# Patient Record
Sex: Female | Born: 2000 | Race: Black or African American | Hispanic: No | Marital: Single | Smoking: Never smoker
Health system: Southern US, Community
[De-identification: ages and names within clinical notes are randomized; demographics above are authoritative.]

## PROBLEM LIST (undated history)

## (undated) ENCOUNTER — Inpatient Hospital Stay (HOSPITAL_COMMUNITY): Payer: Self-pay

## (undated) DIAGNOSIS — Z9189 Other specified personal risk factors, not elsewhere classified: Secondary | ICD-10-CM

## (undated) DIAGNOSIS — F321 Major depressive disorder, single episode, moderate: Secondary | ICD-10-CM

## (undated) HISTORY — PX: HERNIA REPAIR: SHX51

## (undated) HISTORY — PX: UMBILICAL HERNIA REPAIR: SUR1181

---
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
## 2018-01-03 IMAGING — DX DG FINGER THUMB 2+V*L*
3 series · 3 of 3 positions shown · non-contrast
Comparison: None.

CLINICAL DATA: Left thumb pain after pain stuck with pencil.

EXAM:
LEFT THUMB 2+V

[finger ap]
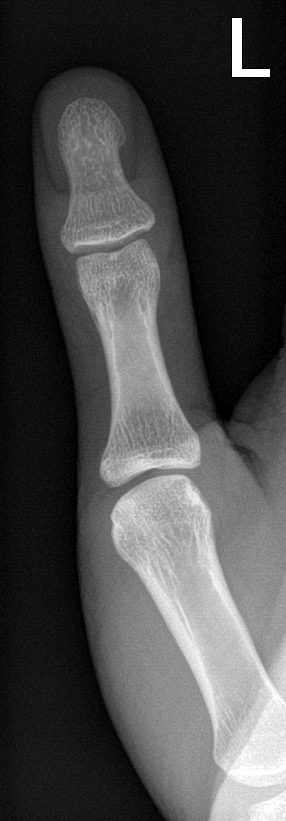

[finger obl]
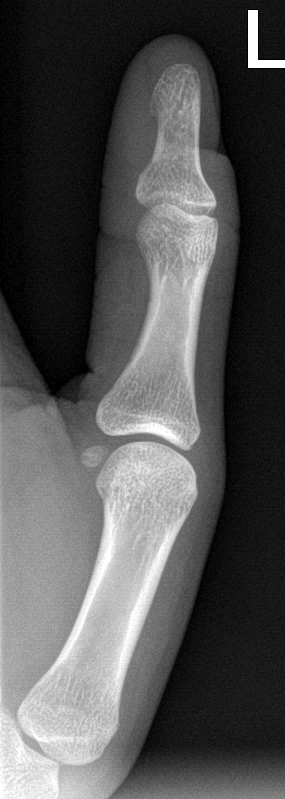

[finger lat]
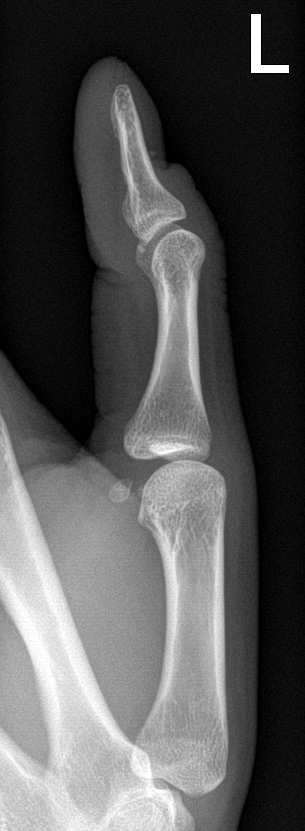

[3 of 3 positions shown; findings below may reference images not displayed]

FINDINGS: There is no evidence of fracture or dislocation. There is no
evidence of arthropathy or other focal bone abnormality. A small
radiodensity is seen along the dorsal aspect of the distal phalanx
on the lateral view, suspicious for a small radiopaque foreign body.
IMPRESSION: Small radiodensity along dorsal aspect of distal phalanx, suspicious
for radiopaque foreign body.

No osseous abnormality.

## 2018-02-22 ENCOUNTER — Emergency Department
Admission: EM | Admit: 2018-02-22 | Discharge: 2018-02-22 | Disposition: A | Discharge status: Home / Self Care | Payer: Medicaid Other | Attending: Emergency Medicine | Admitting: Emergency Medicine

## 2018-02-22 ENCOUNTER — Other Ambulatory Visit: Payer: Self-pay

## 2018-02-22 DIAGNOSIS — Z79899 Other long term (current) drug therapy: Secondary | ICD-10-CM | POA: Diagnosis not present

## 2018-02-22 DIAGNOSIS — J111 Influenza due to unidentified influenza virus with other respiratory manifestations: Secondary | ICD-10-CM | POA: Insufficient documentation

## 2018-02-22 DIAGNOSIS — R05 Cough: Secondary | ICD-10-CM | POA: Diagnosis present

## 2018-02-22 LAB — GROUP A STREP BY PCR: Group A Strep by PCR: NOT DETECTED

## 2018-02-22 MED ORDER — OSELTAMIVIR PHOSPHATE 75 MG PO CAPS: 75.0000 mg | ORAL_CAPSULE | Freq: Two times a day (BID) | ORAL | 0 refills | Status: AC

## 2018-02-22 NOTE — ED Triage Notes (Signed)
Pt A&O, ambulatory. Mask in place. States cough, fever, vomiting. C/o body aches.

## 2018-02-22 NOTE — Discharge Instructions (Addendum)
Follow-up with your regular doctor if not better in 3 days.  Return emergency department worsening. °

## 2018-02-22 NOTE — ED Provider Notes (Signed)
[facility] Emergency Department Provider Note  ____________________________________________   First MD Initiated Contact with Patient 02/22/18 1836     (approximate)  I have reviewed the triage vital signs and the nursing notes.   HISTORY  Chief Complaint Cough and Fever    HPI Valerie Franklin is a 18 y.o. female flulike symptoms, patient is complained of fever, chills, body aches.  cough, sore throat, vomiting, diarrhea; denies chest pain or sob.  Sx for 1 days   Past Medical History:  Diagnosis Date  . At risk for intentional self-harm 05/12/2016  . MDD (major depressive disorder), single episode, moderate (HCC) 05/12/2016    Patient Active Problem List   Diagnosis Date Noted  . GBS bacteriuria 09/07/2017  . Nausea and vomiting during pregnancy 08/19/2017  . MDD (major depressive disorder), single episode, moderate (HCC) 05/12/2016  . At risk for intentional self-harm 05/12/2016    Past Surgical History:  Procedure Laterality Date  . HERNIA REPAIR    . UMBILICAL HERNIA REPAIR      Prior to Admission medications   Medication Sig Start Date End Date Taking? Authorizing Provider  levonorgestrel-ethinyl estradiol (VIENVA) 0.1-20 MG-MCG tablet Take 1 tablet by mouth daily. 10/03/17   Constant, Peggy, MD  oseltamivir (TAMIFLU) 75 MG capsule Take 1 capsule (75 mg total) by mouth 2 (two) times daily. 02/22/18   Faythe Ghee, PA-C    Allergies Patient has no known allergies.  Family History  Problem Relation Age of Onset  . Stroke Paternal Grandfather     Social History Social History   Tobacco Use  . Smoking status: Never Smoker  . Smokeless tobacco: Never Used  Substance Use Topics  . Alcohol use: No  . Drug use: Not Currently    Frequency: 4.0 times per week    Types: Marijuana    Comment: Weekly    Review of Systems  Constitutional: Positive fever/chills Eyes: No visual changes. ENT: Positive sore throat. Respiratory:  Positive cough Gastrointestinal: Positive vomiting and diarrhea, 1 episode of each earlier Genitourinary: Negative for dysuria. Musculoskeletal: Negative for back pain. Skin: Negative for rash.    ____________________________________________   PHYSICAL EXAM:  VITAL SIGNS: ED Triage Vitals  Enc Vitals Group     BP 02/22/18 1804 97/71     Pulse Rate 02/22/18 1804 (!) 123     Resp 02/22/18 1804 18     Temp 02/22/18 1804 99.3 F (37.4 C)     Temp src --      SpO2 02/22/18 1804 96 %     Weight 02/22/18 1803 119 lb (54 kg)     Height 02/22/18 1803 5\' 5"  (1.651 m)     Head Circumference --      Peak Flow --      Pain Score 02/22/18 1803 8     Pain Loc --      Pain Edu? --      Excl. in GC? --     Constitutional: Alert and oriented. Well appearing and in no acute distress. Eyes: Conjunctivae are normal.  Head: Atraumatic. Nose: No congestion/rhinnorhea. Mouth/Throat: Mucous membranes are moist.  Throat is red Neck:  supple no lymphadenopathy noted Cardiovascular: Normal rate, regular rhythm. Heart sounds are normal Respiratory: Normal respiratory effort.  No retractions, lungs c t a  Abd: soft nontender bs normal all 4 quad GU: deferred Musculoskeletal: FROM all extremities, warm and well perfused Neurologic:  Normal speech and language.  Skin:  Skin is warm,  dry and intact. No rash noted. Psychiatric: Mood and affect are normal. Speech and behavior are normal.  ____________________________________________   LABS (all labs ordered are listed, but only abnormal results are displayed)  Labs Reviewed  GROUP A STREP BY PCR   ____________________________________________   ____________________________________________  RADIOLOGY    ____________________________________________   PROCEDURES  Procedure(s) performed: No  Procedures    ____________________________________________   INITIAL IMPRESSION / ASSESSMENT AND PLAN / ED COURSE  Pertinent labs &  imaging results that were available during my care of the patient were reviewed by me and considered in my medical decision making (see chart for details).   Patient is 18 year old female presents emergency department complaint flulike symptoms.  Physical exam shows a febrile patient.  Throat is red.  Remainder exam is unremarkable  Influenza test was not obtained as she is a healthy adult with very typical flulike symptoms.  Strep test is negative  Explained findings to the patient.  She was given a prescription for Tamiflu.  She is to follow-up with regular doctor if not better in 3 days.  Return emergency department worsening.  She states she understands and will comply.  She was discharged in stable condition.     As part of my medical decision making, I reviewed the following data within the electronic MEDICAL RECORD NUMBER Nursing notes reviewed and incorporated, Labs reviewed strep test is negative, Notes from prior ED visits and  Controlled Substance Database  ____________________________________________   FINAL CLINICAL IMPRESSION(S) / ED DIAGNOSES  Final diagnoses:  Influenza      NEW MEDICATIONS STARTED DURING THIS VISIT:  New Prescriptions   OSELTAMIVIR (TAMIFLU) 75 MG CAPSULE    Take 1 capsule (75 mg total) by mouth 2 (two) times daily.     Note:  This document was prepared using Dragon voice recognition software and may include unintentional dictation errors.    Faythe GheeFisher, [place] Farone W, PA-C 02/22/18 1933    Phineas SemenGoodman, Gray[place], MD 02/22/18 Rosamaria [id number]

## 2018-02-22 NOTE — ED Notes (Signed)
Reference triage note. Pt in NAD at this time.  

## 2019-02-12 ENCOUNTER — Other Ambulatory Visit: Payer: Medicaid Other

## 2019-02-13 ENCOUNTER — Ambulatory Visit: Payer: Medicaid Other | Attending: Internal Medicine

## 2019-02-13 DIAGNOSIS — Z20822 Contact with and (suspected) exposure to covid-19: Secondary | ICD-10-CM

## 2019-02-14 LAB — NOVEL CORONAVIRUS, NAA: SARS-CoV-2, NAA: DETECTED — AB

## 2019-04-15 IMAGING — US US OB < 14 WEEKS - US OB TV
1 series · 15 of 28 positions shown · non-contrast
Comparison: None.

CLINICAL DATA: Pelvic pain today.  Positive urine pregnancy test.

EXAM:
OBSTETRIC <14 WK US AND TRANSVAGINAL OB US
TECHNIQUE: Both transabdominal and transvaginal ultrasound examinations were
performed for complete evaluation of the gestation as well as the
maternal uterus, adnexal regions, and pelvic cul-de-sac.
Transvaginal technique was performed to assess early pregnancy.

[Series 1: us ob < 14 weeks - us ob tv · 15 of 67 slices shown]
[im 1/67]
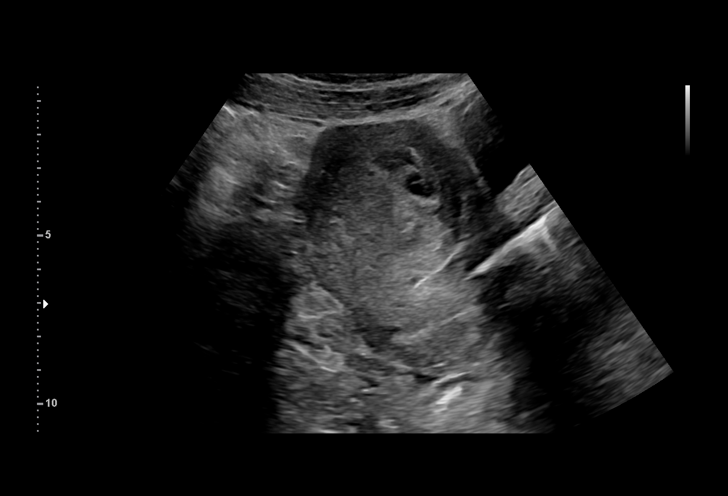
[im 5/67]
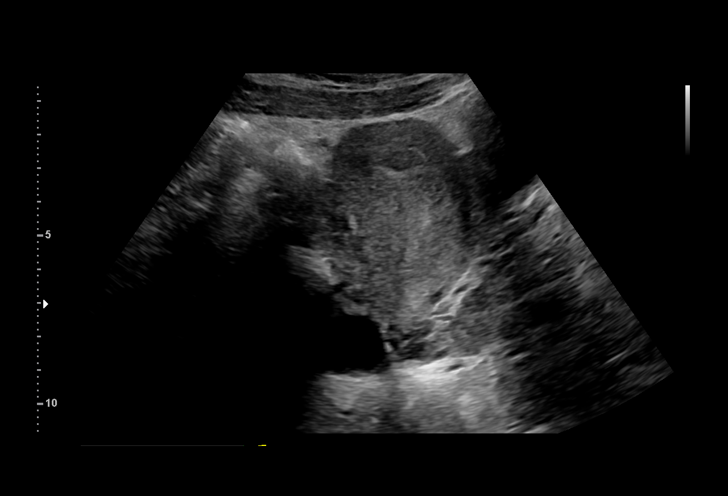
[im 10/67]
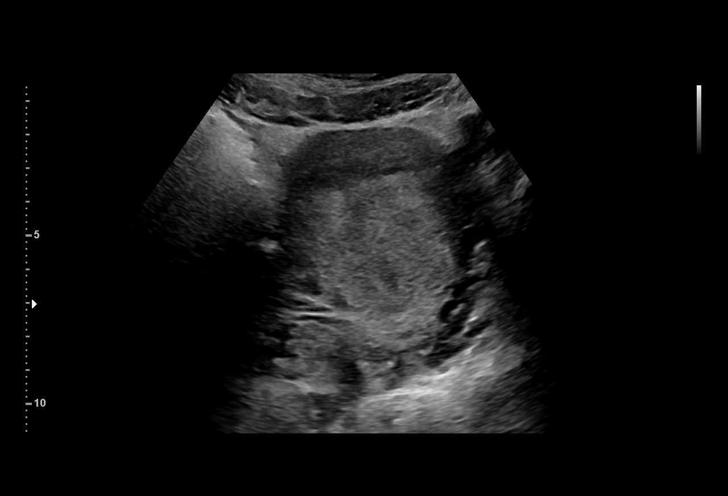
[im 15/67]
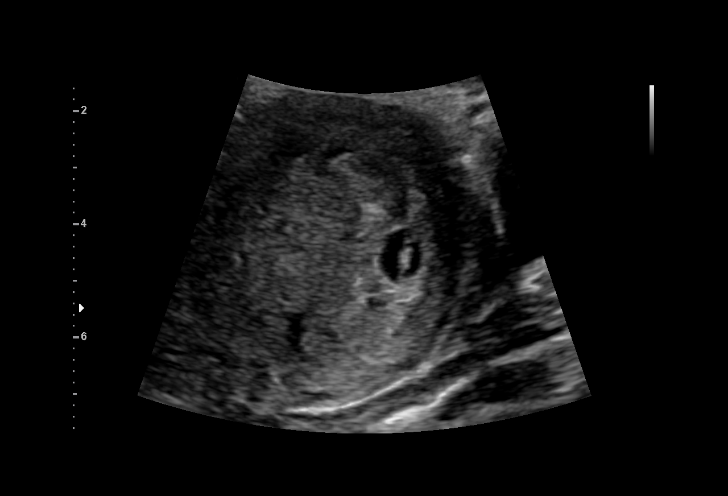
[im 20/67]
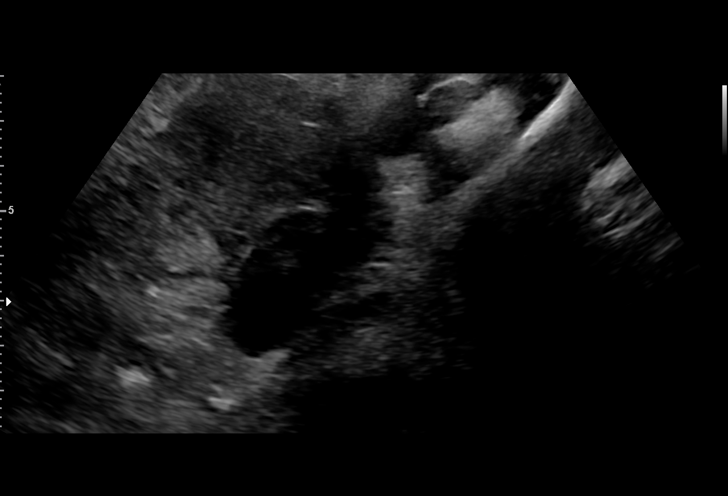
[im 25/67]
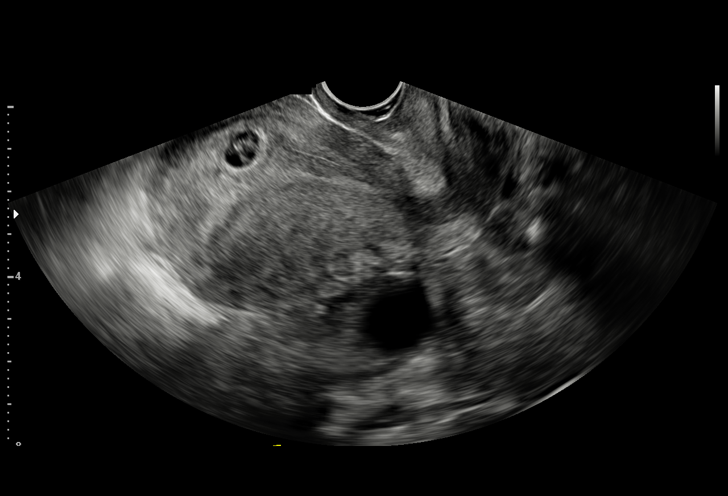
[im 30/67]
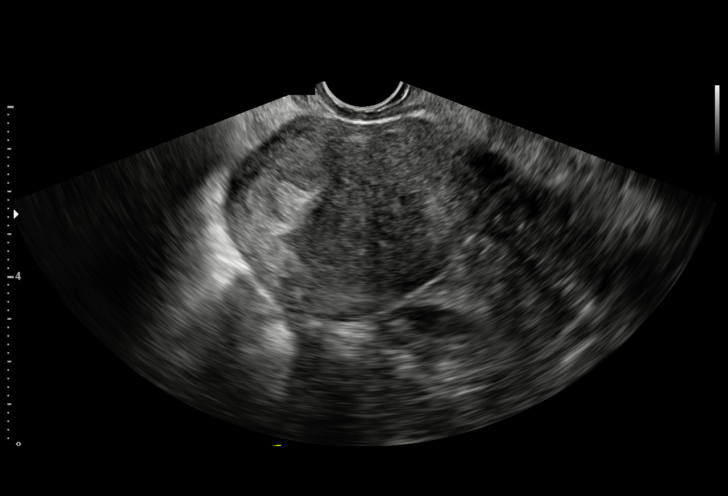
[im 35/67]
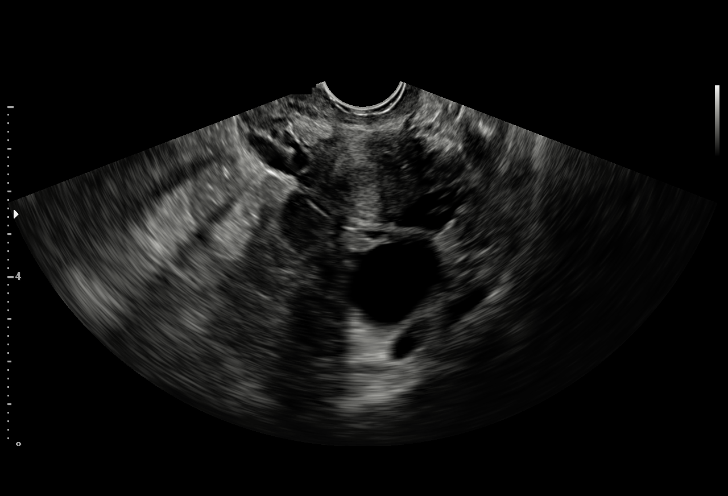
[im 37/67]
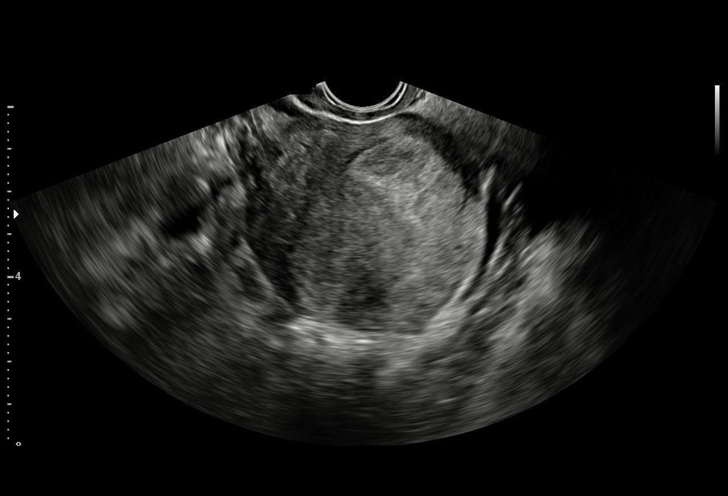
[im 42/67]
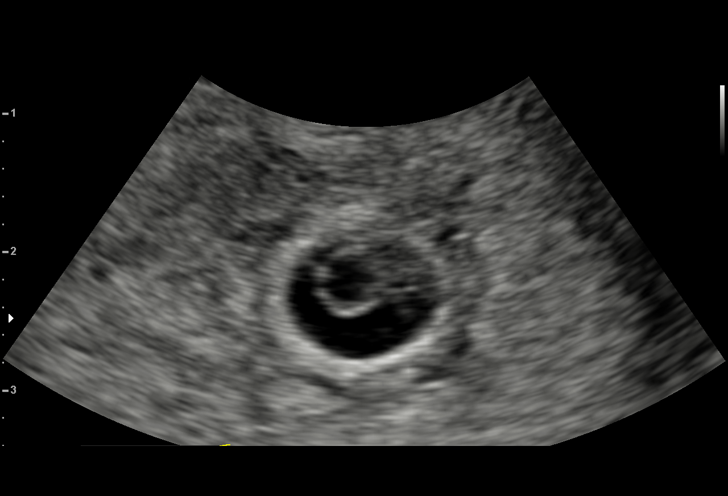
[im 47/67]
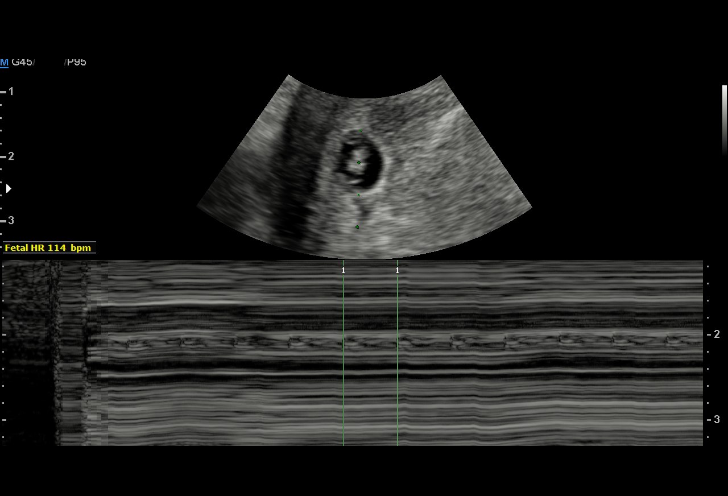
[im 52/67]
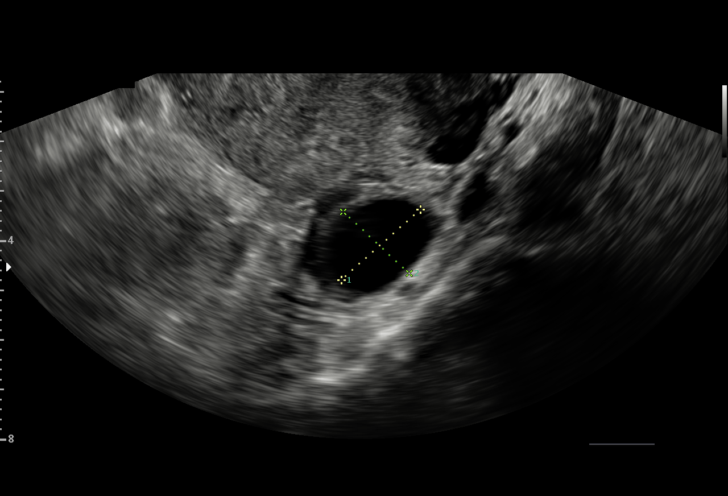
[im 57/67]
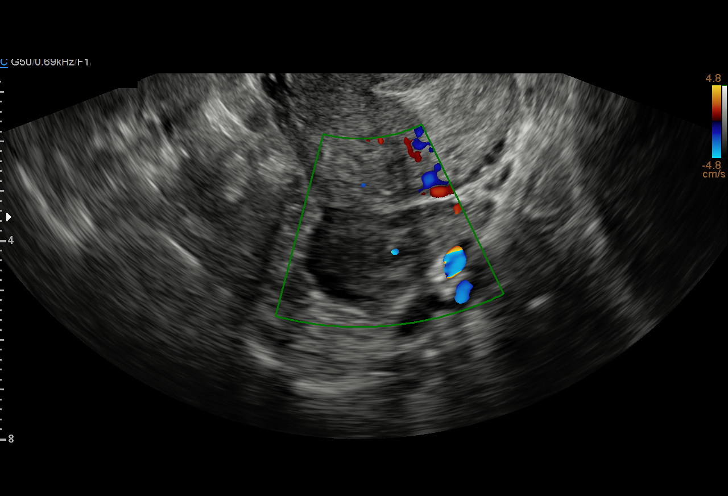
[im 62/67]
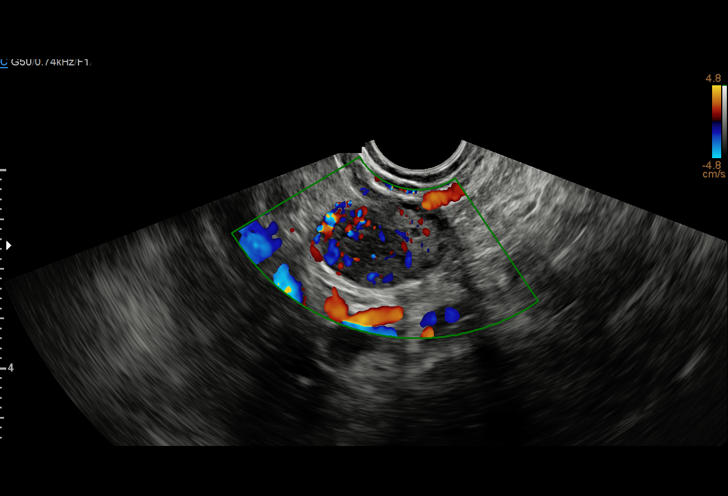
[im 67/67]
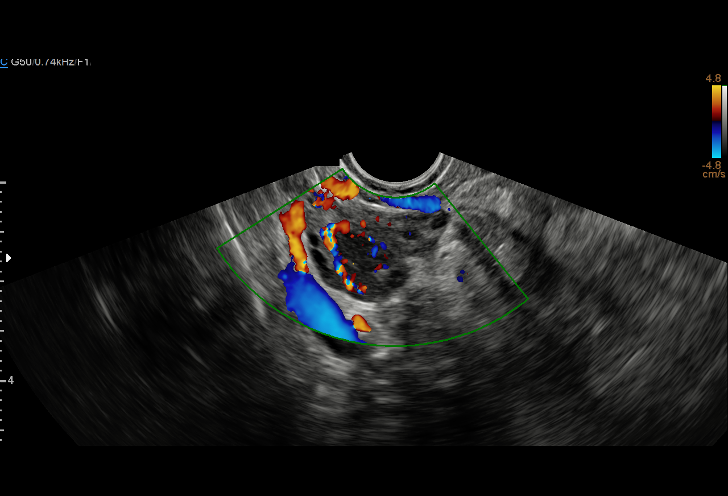

[15 of 28 positions shown; findings below may reference images not displayed]

FINDINGS: Intrauterine gestational sac: Single

Yolk sac:  Visualized

Embryo:  Visualized

Cardiac Activity: Visualized

Heart Rate: 114 bpm

CRL: 4.1 mm   6 w   1 d                  US EDC: 04/30/2018

Subchorionic hemorrhage:  None visualized.

Maternal uterus/adnexae: Corpus luteal cyst noted on the right.
Follicle measuring 2.1 x 1.8 x 2 cm noted of the left ovary.
IMPRESSION: Single live intrauterine 6 week 1 day gestation without complicating
features.

## 2019-04-28 IMAGING — US US OB TRANSVAGINAL
1 series · 15 of 21 positions shown · non-contrast
Comparison: None.

CLINICAL DATA: Pregnant patient with vaginal bleeding.

EXAM:
TRANSVAGINAL OB ULTRASOUND
TECHNIQUE: Transvaginal ultrasound was performed for complete evaluation of the
gestation as well as the maternal uterus, adnexal regions, and
pelvic cul-de-sac.

[Series 1: us ob transvaginal · 21 acquisitions, 15 frames shown]
[im 1/21]
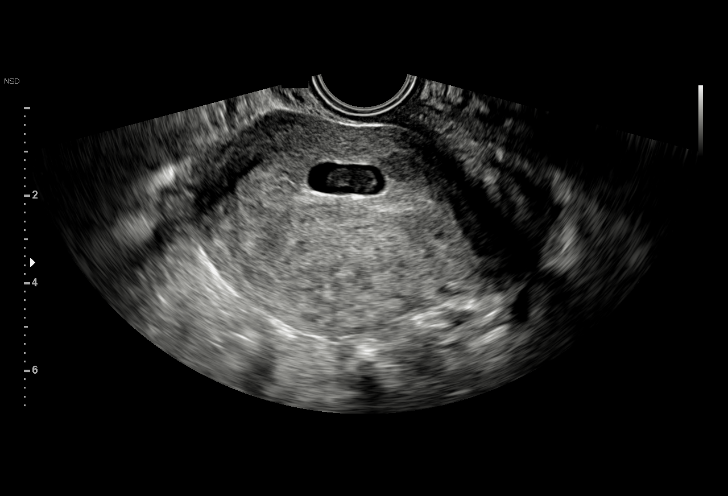
[im 3/21]
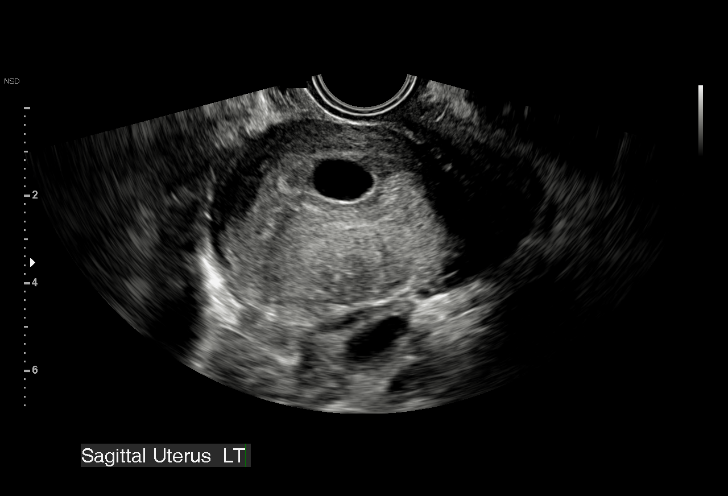
[im 4/21]
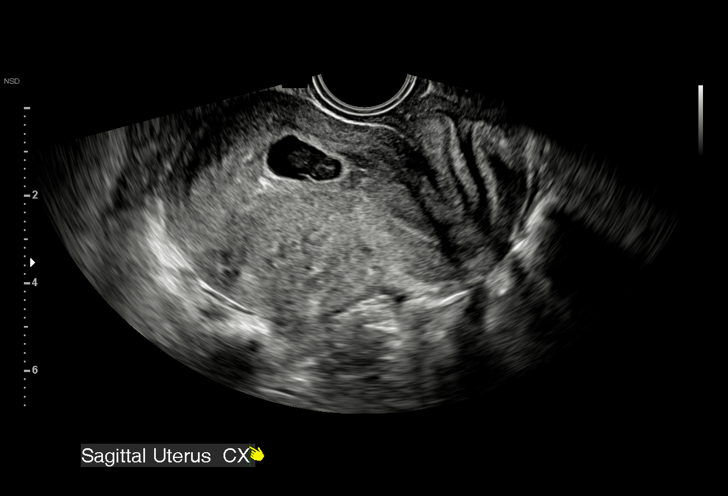
[im 5/21]
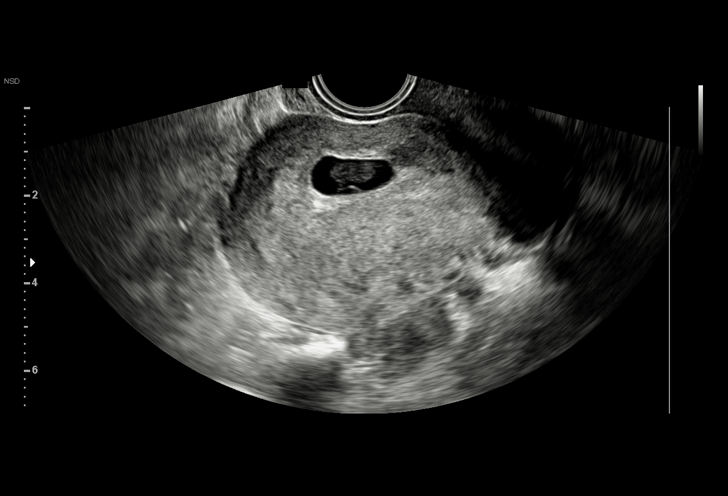
[im 7/21]
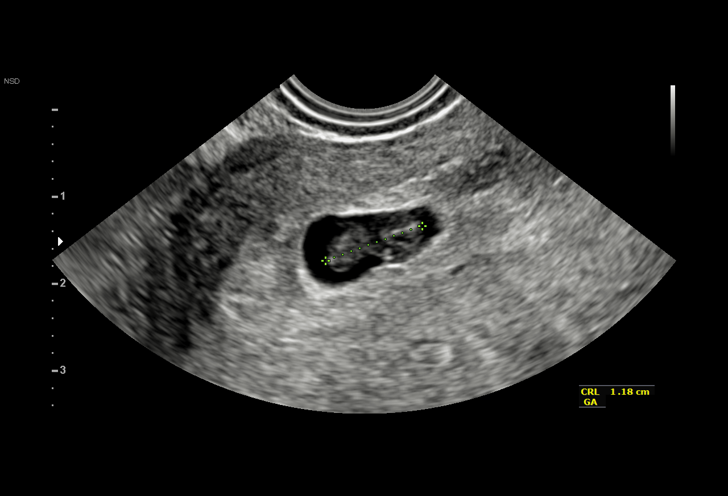
[im 8/21]
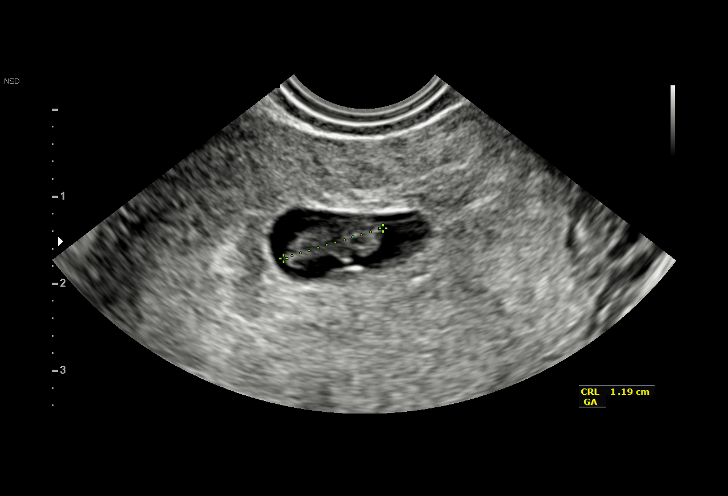
[im 10/21]
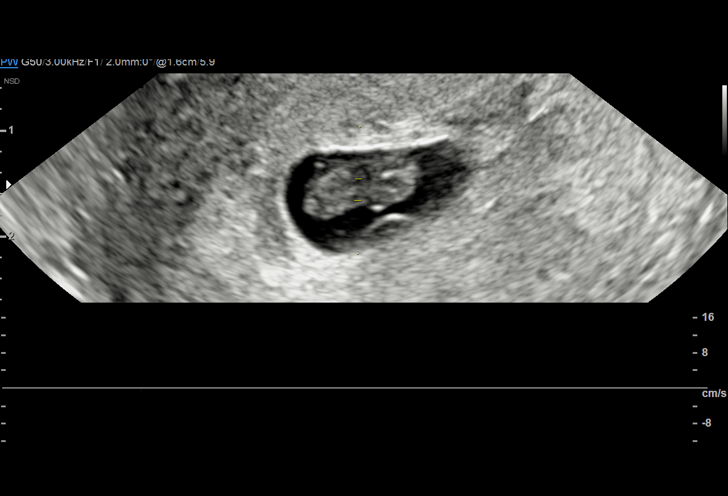
[im 11/21]
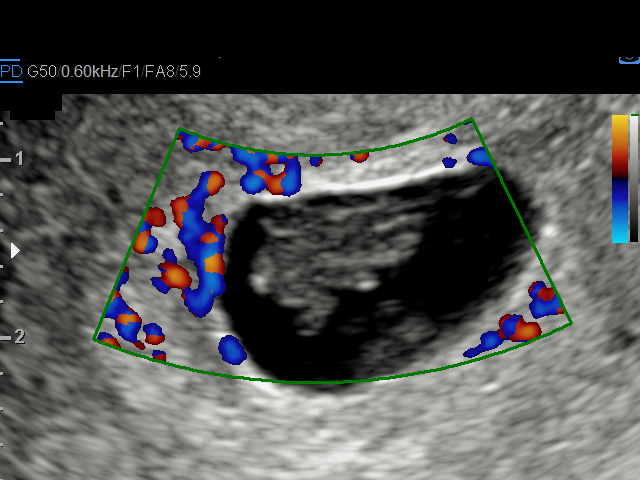
[im 12/21]
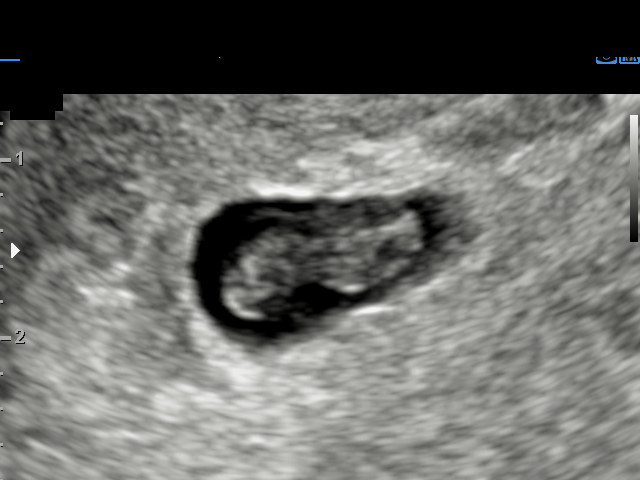
[im 14/21]
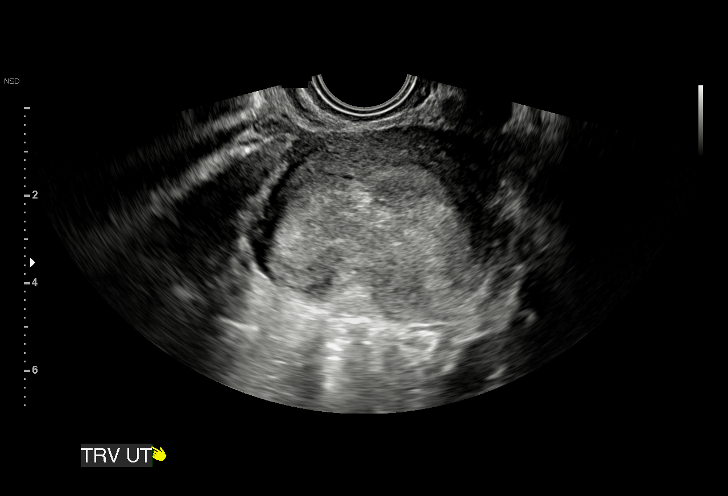
[im 15/21]
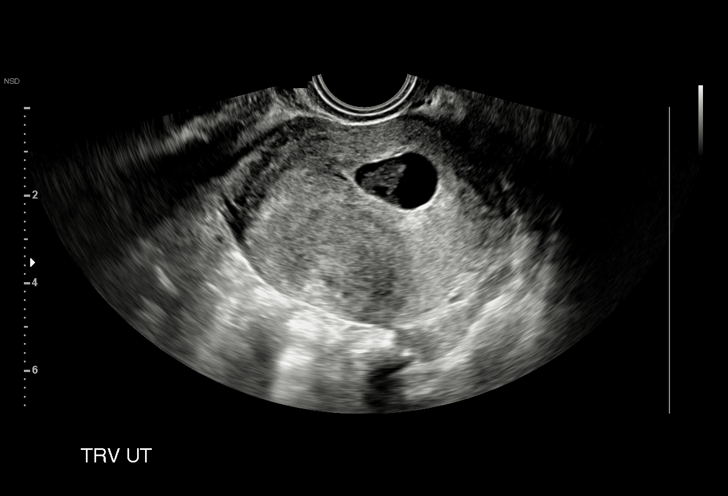
[im 17/21]
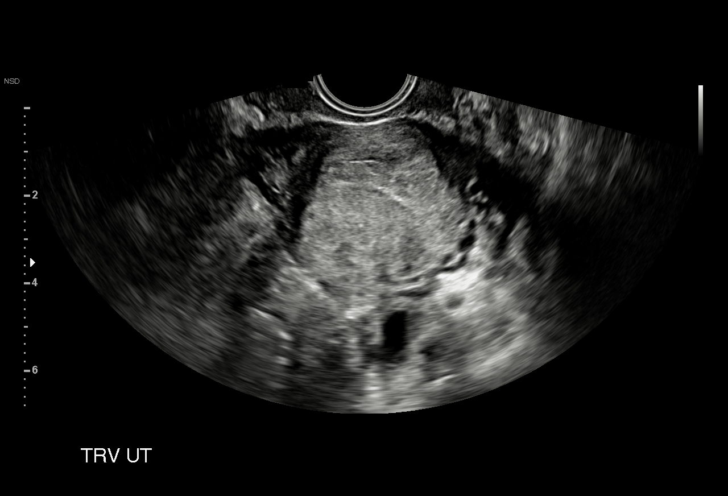
[im 18/21]
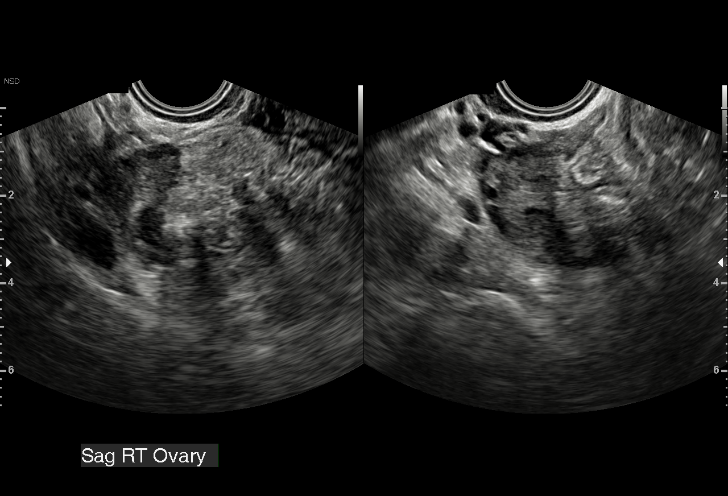
[im 19/21]
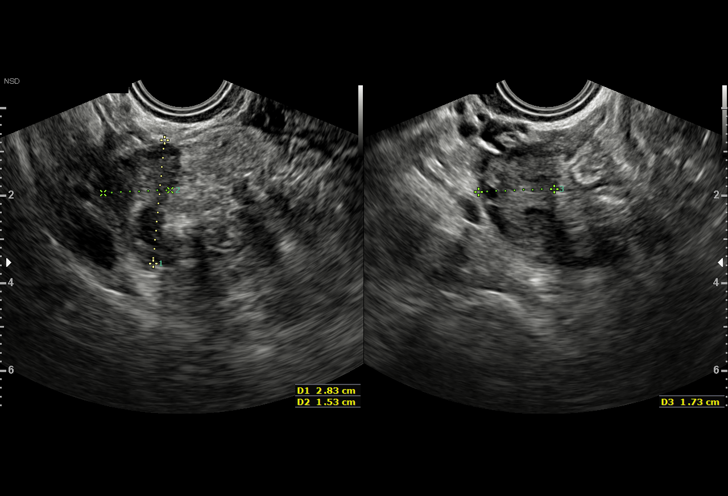
[im 21/21]
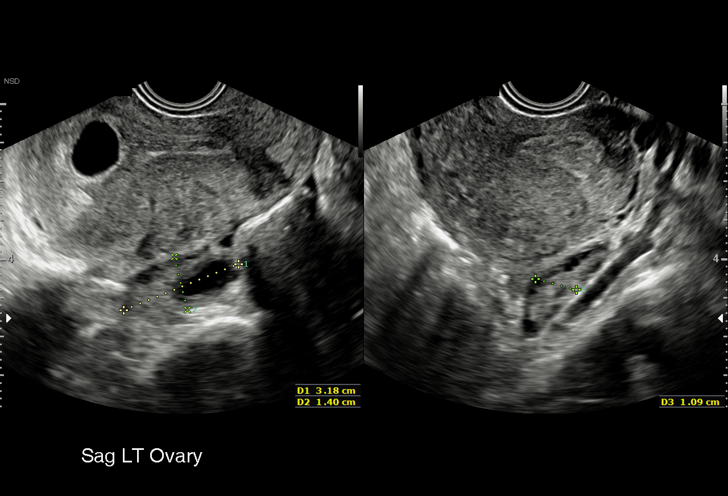

[15 of 21 positions shown; findings below may reference images not displayed]

FINDINGS: Intrauterine gestational sac: Single

Yolk sac:  Visualized.

Embryo:  Visualized.

Cardiac Activity: Not Visualized.

Heart Rate: 0 bpm

CRL:   11.6 mm   7 w 2 d                  US EDC: 05/05/2018

Subchorionic hemorrhage:  None visualized.

Maternal uterus/adnexae: Normal right and left ovaries.
IMPRESSION: Intrauterine gestation with a crown-rump length of 11.6 mm. No
heartbeat identified. Findings meet definitive criteria for failed
pregnancy. This follows SRU consensus guidelines: Diagnostic
Criteria for Nonviable Pregnancy Early in the First Trimester. N
Engl J Med 7556;[DATE].

## 2020-01-29 ENCOUNTER — Other Ambulatory Visit: Payer: Self-pay

## 2020-01-29 ENCOUNTER — Encounter (HOSPITAL_COMMUNITY): Payer: Self-pay

## 2020-01-29 DIAGNOSIS — Z5321 Procedure and treatment not carried out due to patient leaving prior to being seen by health care provider: Secondary | ICD-10-CM | POA: Diagnosis not present

## 2020-01-29 DIAGNOSIS — J029 Acute pharyngitis, unspecified: Secondary | ICD-10-CM | POA: Diagnosis not present

## 2020-01-29 DIAGNOSIS — R519 Headache, unspecified: Secondary | ICD-10-CM | POA: Diagnosis present

## 2020-01-29 NOTE — ED Triage Notes (Signed)
Patient arrived with complaints of body aches, chills, headache, and sore throat. Reports using some OTC flu medication. Reports coming for a covid-19 swab.

## 2020-01-30 ENCOUNTER — Emergency Department (HOSPITAL_COMMUNITY)
Admission: EM | Admit: 2020-01-30 | Discharge: 2020-01-30 | Disposition: A | Discharge status: Home / Self Care | Payer: Medicaid Other | Attending: Physician Assistant | Admitting: Physician Assistant

## 2020-03-24 ENCOUNTER — Other Ambulatory Visit (HOSPITAL_COMMUNITY)
Admission: RE | Admit: 2020-03-24 | Discharge: 2020-03-24 | Disposition: A | Discharge status: Home / Self Care | Payer: Medicaid Other | Source: Ambulatory Visit | Attending: Physician Assistant | Admitting: Physician Assistant

## 2020-03-24 DIAGNOSIS — Z124 Encounter for screening for malignant neoplasm of cervix: Secondary | ICD-10-CM | POA: Insufficient documentation

## 2020-03-26 LAB — CYTOLOGY - PAP
Adequacy: ABSENT
Comment: NEGATIVE
Diagnosis: NEGATIVE
High risk HPV: NEGATIVE

## 2020-04-21 ENCOUNTER — Encounter (HOSPITAL_COMMUNITY): Payer: Self-pay | Admitting: Registered Nurse

## 2020-04-21 ENCOUNTER — Other Ambulatory Visit: Payer: Self-pay

## 2020-04-21 ENCOUNTER — Ambulatory Visit (HOSPITAL_COMMUNITY)
Admission: EM | Admit: 2020-04-21 | Discharge: 2020-04-21 | Disposition: A | Discharge status: Home / Self Care | Payer: Medicaid Other | Attending: Registered Nurse | Admitting: Registered Nurse

## 2020-04-21 DIAGNOSIS — Z9152 Personal history of nonsuicidal self-harm: Secondary | ICD-10-CM | POA: Insufficient documentation

## 2020-04-21 DIAGNOSIS — Z9189 Other specified personal risk factors, not elsewhere classified: Secondary | ICD-10-CM

## 2020-04-21 DIAGNOSIS — F331 Major depressive disorder, recurrent, moderate: Secondary | ICD-10-CM | POA: Diagnosis present

## 2020-04-21 DIAGNOSIS — R45851 Suicidal ideations: Secondary | ICD-10-CM | POA: Insufficient documentation

## 2020-04-21 DIAGNOSIS — F1220 Cannabis dependence, uncomplicated: Secondary | ICD-10-CM | POA: Insufficient documentation

## 2020-04-21 NOTE — ED Provider Notes (Signed)
Behavioral Health Urgent Care Medical Screening Exam  Patient Name: Valerie Franklin MRN: [id number] Date of Evaluation: 04/21/20 Chief Complaint:   Diagnosis:  Final diagnoses:  MDD (major depressive disorder), recurrent episode, moderate (HCC)  Passive suicidal ideations  At risk for intentional self-harm  Cannabis use disorder, moderate, dependence (HCC)    History of Present illness: Valerie Franklin is a 20 y.o. female patient presented to [facility] as a walk in with complaints of depression and seeking resources for outpatient psychiatric services  Valerie Franklin, 20 y.o., female patient seen face to face by this provider, consulted with Dr. Bronwen Betters; and chart reviewed on 04/21/20.  On evaluation Valerie Franklin reports she has been having depression and passive suicidal thoughts.  Patient states she has thought of using a knife but hasn't acted on it and has not intent; states she doesn't want to kill herself.  Patient lives with her mother and sisters but states she is unable to talk to her mother about her problems and that is why she is seeking therapy.  Patient denies prior suicide attempt and is able to contract for safety.   During evaluation Valerie Franklin is sitting up right in chair in no acute distress.  She is alert, oriented x 4, calm and cooperative.  Her mood is dysphoric with congruent affect.  She does not appear to be responding to internal/external stimuli or delusional thoughts.  Patient denies suicidal/self-harm/homicidal ideation, psychosis, and paranoia.  Patient answered question appropriately.   Safety plan: Patient will reach out to a friend, call 911, mobile crisis, or go to the nears ED if suicidal thoughts become active Patient to follow up with [facility] Open Access tomorrow morning  The suicide prevention education provided includes the following:  Suicide risk factors  Suicide prevention and interventions  National Suicide Hotline telephone number  [facility] assessment telephone number  [facility] Emergency Assistance 911  [facility] and/or Residential Mobile Crisis Unit telephone number   Request made of patient:  Remove weapons (e.g., guns, rifles, knives), all items previously/currently identified as safety concern.   Remove drugs/medications (over the counter, prescriptions, illicit drugs), all items previously/currently identified as a safety concern.       Psychiatric Specialty Exam  Presentation  General Appearance:Appropriate for Environment; Casual  Eye Contact:Good  Speech:Clear and Coherent; Normal Rate  Speech Volume:Normal  Handedness:Right   Mood and Affect  Mood:Dysphoric  Affect:Appropriate; Congruent   Thought Process  Thought Processes:Coherent; Goal Directed  Descriptions of Associations:Intact  Orientation:Full (Time, Place and Person)  Thought Content:WDL    Hallucinations:None  Ideas of Reference:None  Suicidal Thoughts:Yes, Passive Without Intent; With Plan (Patient states she has had suicidal thoughts of cutting herself.  States she doesn't want to die and has not attempted to cut herself.  Patient denies prior suicide attempt and self harming)  Homicidal Thoughts:No   Sensorium  Memory:Immediate Good; Recent Good; Remote Good  Judgment:Intact  Insight:Present   Executive Functions  Concentration:Good  Attention Span:Good  Recall:Good  Fund of Knowledge:Good  Language:Good   Psychomotor Activity  Psychomotor Activity:Normal   Assets  Assets:Communication Skills; Desire for Improvement; Housing; Leisure Time; Physical Health; Social Support   Sleep  Sleep:Good  Number of hours: No data recorded  Nutritional Assessment (For OBS and FBC admissions only) Has the patient had a weight loss or gain of 10 pounds or more in the last 3 months?: No Has the patient had a decrease in food intake/or  appetite?: No Does the patient have dental  problems?: No Does the patient have eating habits or behaviors that may be indicators of an eating disorder including binging or inducing vomiting?: No Has the patient recently lost weight without trying?: No    Physical Exam: Physical Exam Vitals and nursing note reviewed. Exam conducted with a chaperone present.  Constitutional:      General: She is not in acute distress.    Appearance: Normal appearance. She is not ill-appearing.  HENT:     Head: Normocephalic.  Eyes:     Pupils: Pupils are equal, round, and reactive to light.  Cardiovascular:     Rate and Rhythm: Normal rate.  Pulmonary:     Effort: Pulmonary effort is normal.  Musculoskeletal:        General: Normal range of motion.     Cervical back: Normal range of motion.  Skin:    General: Skin is warm and dry.  Neurological:     Mental Status: She is alert and oriented to person, place, and time.  Psychiatric:        Attention and Perception: Attention and perception normal. She does not perceive auditory or visual hallucinations.        Mood and Affect: Mood and affect normal.        Speech: Speech normal.        Behavior: Behavior normal. Behavior is cooperative.        Thought Content: Thought content normal. Thought content is not paranoid or delusional. Thought content does not include homicidal or suicidal ideation.        Cognition and Memory: Cognition and memory normal.        Judgment: Judgment normal.    Review of Systems  Constitutional: Negative.   HENT: Negative.   Eyes: Negative.   Respiratory: Negative.   Cardiovascular: Negative.   Gastrointestinal: Negative.   Genitourinary: Negative.   Musculoskeletal: Negative.   Skin: Negative.   Neurological: Negative.   Endo/Heme/Allergies: Negative.   Psychiatric/Behavioral: Positive for depression (Stable). Negative for hallucinations. Suicidal ideas: States she has had thughts but doesn't  want to kill herself. Nervous/anxious: Stable.         Patient states that she is wanting outpatient psychiatric services    Blood pressure 117/78, pulse 98, temperature 98 F (36.7 C), temperature source Oral, resp. rate 16, SpO2 100 %, unknown if currently breastfeeding. There is no height or weight on file to calculate BMI.  Musculoskeletal: Strength & Muscle Tone: within normal limits Gait & Station: normal Patient leans: N/A   [facility] MSE Discharge Disposition for Follow up and Recommendations: Based on my evaluation the patient does not appear to have an emergency medical condition and can be discharged with resources and follow up care in outpatient services for Medication Management and Individual Therapy    Follow-up Information    Go to  [facility].   Specialty: Urgent Care Why: Open Access:  Monday - Thursday from 8 am to 11 am for medication management and therapy intake.  On Friday from 1 pm to 4 pm for therapy intake only Contact information: [address] [place] [address] [phone]               Assunta Found, NP 04/21/2020, 2:02 PM

## 2020-04-21 NOTE — Progress Notes (Signed)
Valerie Franklin received her AVS, questions answered, retrieved her personal belongings and was escorted to the front lobby to wait g=for [employer] to transfer her back home.

## 2020-04-21 NOTE — BH Assessment (Signed)
Patient presents to [facility] voluntarily by [employer]. Patient reports symptoms of depression and SI/ w/ no plan or intent. Patient has history of cutting , last episode was 2-3 years ago . Patient reports 1 previous inpatient admission at [facility] as a child for self harm. Patient denies SI/ HI/ AVH  During triage . Patient looking for outpatient therapy and medications. Patient is urgent .

## 2020-04-21 NOTE — BH Assessment (Signed)
Comprehensive Clinical Assessment (CCA) Note  04/21/2020 Valerie Franklin [id number]   DISPOSITION: Gave clinical report to Assunta Found, NP who determined Pt does not  meets criteria for inpatient psychiatric treatment. Notified Dr. Earlene Plater and Diona Fanti, RN of disposition recommendation and the sitter utilization recommendation.   Flowsheet Row ED from 04/21/2020 in [facility]  C-SSRS RISK CATEGORY High Risk     The patient demonstrates the following risk factors for suicide: Chronic risk factors for suicide include: previous self-harm cutting in the past . Acute risk factors for suicide include: family or marital conflict and loss (financial, interpersonal, professional). Protective factors for this patient include: responsibility to others (children, family). Considering these factors, the overall suicide risk at this point appears to be high. Patient is appropriate for outpatient follow up.  Valerie Franklin reports she has been having depression and passive suicidal thoughts.  Patient states she has thought of using a knife but hasn't acted on it and has not intent; states she doesn't want to kill herself.  Patient lives with her mother and sisters but states she is unable to talk to her mother about her problems and that is why she is seeking therapy.  Patient denies prior suicide attempt and is able to contract for safety.    Pt lives with mother and siblings and supports are limited . Pt denies a hx of abuse and trauma. Pt denies there is a family history of mental health. Pt is unemployed  work history includes ?[employer] Pt has ?insight and judgment. Pt's memory is ?.Legal history includes family, financial and relationship stressors. Pt denies OP /IP history . Pt reports/alcohol/ substance abuse. Patient reports smoking 1-2 blunts daily of THC.  During evaluation Valerie Franklin is sitting up right in chair in no acute distress.  She is alert, oriented x  4, calm and cooperative.  Her mood is dysphoric with congruent affect.  She does not appear to be responding to internal/external stimuli or delusional thoughts.  Patient denies suicidal/self-harm/homicidal ideation, psychosis, and paranoia.  Patient answered question appropriately.   DISPOSITION: Gave clinical report to Assunta Found, NP who determined Pt does not  meets criteria for inpatient psychiatric treatment. Notified Dr. Earlene Plater and Diona Fanti, RN of disposition recommendation and the sitter utilization recommendation.   Provider Note :   Safety plan: Patient will reach out to a friend, call 911, mobile crisis, or go to the nears ED if suicidal thoughts become active Patient to follow up with [facility] Open Access tomorrow morning  The suicide prevention education provided includes the following:  Suicide risk factors  Suicide prevention and interventions  National Suicide Hotline telephone number  [facility] assessment telephone number  [facility] Emergency Assistance 911  [facility] and/or Residential Mobile Crisis Unit telephone number  Request made of patient:  Remove weapons (e.g., guns, rifles, knives), all items previously/currently identified as safety concern.  Remove drugs/medications (over the counter, prescriptions, illicit drugs), all items previously/currently identified as a safety concern.     [facility] MSE Discharge Disposition for Follow up and Recommendations: Based on my evaluation the patient does not appear to have an emergency medical condition and can be discharged with resources and follow up care in outpatient services for Medication Management and Individual Therapy   [facility].   Specialty: Urgent Care Why: Open Access:  Monday - Thursday from 8 am to 11 am for medication management and therapy intake.  On Friday from 1 pm to 4 pm for therapy intake only Contact  information: [address] [place] [address] [phone]    Chief Complaint:  Chief Complaint  Patient presents with  . Urgent Emergent Evaluation   Visit Diagnosis:  MDD (major depressive disorder), recurrent episode, moderate (HCC)  Passive suicidal ideations  At risk for intentional self-harm  Cannabis use disorder, moderate, dependence (HCC)       CCA Screening, Triage and Referral (STR)  Patient Reported Information How did you hear about [place]? Self  Referral name: No data recorded Referral phone number: No data recorded  Whom do you see for routine medical problems? No data recorded Practice/Facility Name: No data recorded Practice/Facility Phone Number: No data recorded Name of Contact: No data recorded Contact Number: No data recorded Contact Fax Number: No data recorded Prescriber Name: No data recorded Prescriber Address (if known): No data recorded  What Is the Reason for Your Visit/Call Today? depression / SI  How Long Has This Been Causing You Problems? 1-6 months  What Do You Feel Would Help You the Most Today? Treatment for Depression or other mood problem   Have You Recently Been in Any Inpatient Treatment (Hospital/Detox/Crisis Center/28-Day Program)? No data recorded Name/Location of Program/Hospital:No data recorded How Long Were You There? No data recorded When Were You Discharged? No data recorded  Have You Ever Received Services From [facility] Before? No data recorded Who Do You See at [facility]? No data recorded  Have You Recently Had Any Thoughts About Hurting Yourself? Yes  Are You Planning to Commit Suicide/Harm Yourself At This time? No   Have you Recently Had Thoughts About Hurting Someone Karolee Ohs? No  Explanation: No data recorded  Have You Used Any Alcohol or Drugs in the Past 24 Hours? Yes  How Long Ago Did You Use Drugs or Alcohol? No data recorded What Did You Use and How Much? 1-2 blunts   Do You  Currently Have a Therapist/Psychiatrist? No data recorded Name of Therapist/Psychiatrist: No data recorded  Have You Been Recently Discharged From Any Office Practice or Programs? No data recorded Explanation of Discharge From Practice/Program: No data recorded    CCA Screening Triage Referral Assessment Type of Contact: No data recorded Is this Initial or Reassessment? No data recorded Date Telepsych consult ordered in [facility]:  No data recorded Time Telepsych consult ordered in [facility]:  No data recorded  Patient Reported Information Reviewed? No data recorded Patient Left Without Being Seen? No data recorded Reason for Not Completing Assessment: No data recorded  Collateral Involvement: No data recorded  Does Patient Have a Court Appointed Legal Guardian? No data recorded Name and Contact of Legal Guardian: No data recorded If Minor and Not Living with Parent(s), Who has Custody? No data recorded Is CPS involved or ever been involved? No data recorded Is APS involved or ever been involved? No data recorded  Patient Determined To Be At Risk for Harm To Self or Others Based on Review of Patient Reported Information or Presenting Complaint? No data recorded Method: No data recorded Availability of Means: No data recorded Intent: No data recorded Notification Required: No data recorded Additional Information for Danger to Others Potential: No data recorded Additional Comments for Danger to Others Potential: No data recorded Are There Guns or Other Weapons in Your Home? No data recorded Types of Guns/Weapons: No data recorded Are These Weapons Safely Secured?  No data recorded Who Could Verify You Are Able To Have These Secured: No data recorded Do You Have any Outstanding Charges, Pending Court Dates, Parole/Probation? No data recorded Contacted To Inform of Risk of Harm To Self or Others: No data recorded  Location of Assessment: No data recorded  Does  Patient Present under Involuntary Commitment? No data recorded IVC Papers Initial File Date: No data recorded  [place]  Patient Currently Receiving the Following Services: No data recorded  Determination of Need: Urgent (48 hours)   Options For Referral: Medication Management; Intensive Outpatient Therapy     CCA Biopsychosocial Intake/Chief Complaint:  No data recorded Current Symptoms/Problems: No data recorded  Patient Reported Schizophrenia/Schizoaffective Diagnosis in Past: No data recorded  Strengths: No data recorded Preferences: No data recorded Abilities: No data recorded  Type of Services Patient Feels are Needed: No data recorded  Initial Clinical Notes/Concerns: No data recorded  Mental Health Symptoms Depression:  No data recorded  Duration of Depressive symptoms: No data recorded  Mania:  No data recorded  Anxiety:   No data recorded  Psychosis:  No data recorded  Duration of Psychotic symptoms: No data recorded  Trauma:  No data recorded  Obsessions:  No data recorded  Compulsions:  No data recorded  Inattention:  No data recorded  Hyperactivity/Impulsivity:  No data recorded  Oppositional/Defiant Behaviors:  No data recorded  Emotional Irregularity:  No data recorded  Other Mood/Personality Symptoms:  No data recorded   Mental Status Exam Appearance and self-care  Stature:  No data recorded  Weight:  No data recorded  Clothing:  No data recorded  Grooming:  No data recorded  Cosmetic use:  No data recorded  Posture/gait:  No data recorded  Motor activity:  No data recorded  Sensorium  Attention:  No data recorded  Concentration:  No data recorded  Orientation:  No data recorded  Recall/memory:  No data recorded  Affect and Mood  Affect:  No data recorded  Mood:  No data recorded  Relating  Eye contact:  No data recorded  Facial expression:  No data recorded  Attitude toward examiner:  No data recorded   Thought and Language  Speech flow: No data recorded  Thought content:  No data recorded  Preoccupation:  No data recorded  Hallucinations:  No data recorded  Organization:  No data recorded  [employer] Computer ServicesExecutive Functions  Fund of Knowledge:  No data recorded  Intelligence:  No data recorded  Abstraction:  No data recorded  Judgement:  No data recorded  Reality Testing:  No data recorded  Insight:  No data recorded  Decision Making:  No data recorded  Social Functioning  Social Maturity:  No data recorded  Social Judgement:  No data recorded  Stress  Stressors:  No data recorded  Coping Ability:  No data recorded  Skill Deficits:  No data recorded  Supports:  No data recorded    Religion:    Leisure/Recreation:    Exercise/Diet:     CCA Employment/Education Employment/Work Situation: Employment / Work Situation Employment situation: Unemployed  Education: Education Is Patient Currently Attending School?: No Did Garment/textile technologistYou Graduate From [employer]High School?: Yes Did Theme park managerYou Attend College?: No Did You Have An Individualized Education Program (IIEP): No Did You Have Any Difficulty At Progress EnergySchool?: No Patient's Education Has Been Impacted by Current Illness: No   CCA Family/Childhood History Family and Relationship History: Family history Marital status: Single Does patient have children?: No  Childhood  History:  Childhood History By whom was/is the patient raised?: Mother Does patient have siblings?: Yes Number of Siblings: 2 Description of patient's current relationship with siblings: good Did patient suffer any verbal/emotional/physical/sexual abuse as a child?: No Did patient suffer from severe childhood neglect?: No Has patient ever been sexually abused/assaulted/raped as an adolescent or adult?: No Was the patient ever a victim of a crime or a disaster?: No Witnessed domestic violence?: No Has patient been affected by domestic violence as an adult?: No  Child/Adolescent  Assessment:     CCA Substance Use Alcohol/Drug Use: Alcohol / Drug Use Pain Medications: See MAR Prescriptions: See MAR Over the Counter: See MAR History of alcohol / drug use?: Yes Substance #1 Name of Substance 1: THC 1 - Amount (size/oz): 1-2 blunts 1 - Frequency: daily 1 - Last Use / Amount: today 1 - Method of Aquiring: purchase 1- Route of Use: oral / smoke                       ASAM's:  Six Dimensions of Multidimensional Assessment  Dimension 1:  Acute Intoxication and/or Withdrawal Potential:   Dimension 1:  Description of individual's past and current experiences of substance use and withdrawal: 3  Dimension 2:  Biomedical Conditions and Complications:   Dimension 2:  Description of patient's biomedical conditions and  complications: 3  Dimension 3:  Emotional, Behavioral, or Cognitive Conditions and Complications:  Dimension 3:  Description of emotional, behavioral, or cognitive conditions and complications: 3  Dimension 4:  Readiness to Change:  Dimension 4:  Description of Readiness to Change criteria: 3  Dimension 5:  Relapse, Continued use, or Continued Problem Potential:  Dimension 5:  Relapse, continued use, or continued problem potential critiera description: 3  Dimension 6:  Recovery/Living Environment:  Dimension 6:  Recovery/Iiving environment criteria description: 3  ASAM Severity Score: ASAM's Severity Rating Score: 18  ASAM Recommended Level of Treatment: ASAM Recommended Level of Treatment: Level I Outpatient Treatment   Substance use Disorder (SUD) Substance Use Disorder (SUD)  Checklist Symptoms of Substance Use: Recurrent use that results in a failure to fulfill major role obligations (work, school, home)  Recommendations for Services/Supports/Treatments: Recommendations for Services/Supports/Treatments Recommendations For Services/Supports/Treatments: Medication Management,IOP (Intensive Outpatient Program),Individual Therapy,SAIOP  (Substance Abuse Intensive Outpatient Program)  DSM5 Diagnoses: Patient Active Problem List   Diagnosis Date Noted  . MDD (major depressive disorder), recurrent episode, moderate (HCC) 04/21/2020  . Passive suicidal ideations 04/21/2020  . GBS bacteriuria 09/07/2017  . Nausea and vomiting during pregnancy 08/19/2017  . MDD (major depressive disorder), single episode, moderate (HCC) 05/12/2016  . At risk for intentional self-harm 05/12/2016    Patient Centered Plan: Patient is on the following Treatment Plan(s):  Referrals to Alternative Service(s): Referred to Alternative Service(s):   Place:   Date:   Time:    Referred to Alternative Service(s):   Place:   Date:   Time:    Referred to Alternative Service(s):   Place:   Date:   Time:    Referred to Alternative Service(s):   Place:   Date:   Time:     Rachel Moulds, [place]

## 2022-10-25 ENCOUNTER — Emergency Department (HOSPITAL_BASED_OUTPATIENT_CLINIC_OR_DEPARTMENT_OTHER): Payer: Medicaid Other

## 2022-10-25 ENCOUNTER — Other Ambulatory Visit: Payer: Self-pay

## 2022-10-25 ENCOUNTER — Encounter (HOSPITAL_BASED_OUTPATIENT_CLINIC_OR_DEPARTMENT_OTHER): Payer: Self-pay

## 2022-10-25 ENCOUNTER — Emergency Department (HOSPITAL_BASED_OUTPATIENT_CLINIC_OR_DEPARTMENT_OTHER)
Admission: EM | Admit: 2022-10-25 | Discharge: 2022-10-25 | Disposition: A | Discharge status: Home / Self Care | Payer: Medicaid Other | Attending: Emergency Medicine | Admitting: Emergency Medicine

## 2022-10-25 ENCOUNTER — Ambulatory Visit: Payer: Medicaid Other

## 2022-10-25 DIAGNOSIS — D259 Leiomyoma of uterus, unspecified: Secondary | ICD-10-CM | POA: Insufficient documentation

## 2022-10-25 DIAGNOSIS — N939 Abnormal uterine and vaginal bleeding, unspecified: Secondary | ICD-10-CM | POA: Diagnosis present

## 2022-10-25 LAB — COMPREHENSIVE METABOLIC PANEL
ALT: 8 U/L (ref 0–44)
AST: 17 U/L (ref 15–41)
Albumin: 4.3 g/dL (ref 3.5–5.0)
Alkaline Phosphatase: 45 U/L (ref 38–126)
Anion gap: 14 (ref 5–15)
BUN: 9 mg/dL (ref 6–20)
CO2: 20 mmol/L — ABNORMAL LOW (ref 22–32)
Calcium: 9.2 mg/dL (ref 8.9–10.3)
Chloride: 104 mmol/L (ref 98–111)
Creatinine, Ser: 0.7 mg/dL (ref 0.44–1.00)
GFR, Estimated: >60 mL/min (ref >60)
Glucose, Bld: 97 mg/dL (ref 70–99)
Potassium: 3.6 mmol/L (ref 3.5–5.1)
Sodium: 138 mmol/L (ref 135–145)
Total Bilirubin: 0.9 mg/dL (ref 0.3–1.2)
Total Protein: 7.2 g/dL (ref 6.5–8.1)

## 2022-10-25 LAB — CBC WITH DIFFERENTIAL/PLATELET
Abs Immature Granulocytes: 0.01 10*3/uL (ref 0.00–0.07)
Basophils Absolute: 0 10*3/uL (ref 0.0–0.1)
Basophils Relative: 1 %
Eosinophils Absolute: 0.1 10*3/uL (ref 0.0–0.5)
Eosinophils Relative: 2 %
HCT: 38.6 % (ref 36.0–46.0)
Hemoglobin: 12.5 g/dL (ref 12.0–15.0)
Immature Granulocytes: 0 %
Lymphocytes Relative: 28 %
Lymphs Abs: 1.4 10*3/uL (ref 0.7–4.0)
MCH: 28.3 pg (ref 26.0–34.0)
MCHC: 32.4 g/dL (ref 30.0–36.0)
MCV: 87.5 fL (ref 80.0–100.0)
Monocytes Absolute: 0.3 10*3/uL (ref 0.1–1.0)
Monocytes Relative: 6 %
Neutro Abs: 3.2 10*3/uL (ref 1.7–7.7)
Neutrophils Relative %: 63 %
Platelets: 258 10*3/uL (ref 150–400)
RBC: 4.41 MIL/uL (ref 3.87–5.11)
RDW: 12.4 % (ref 11.5–15.5)
WBC: 5.1 10*3/uL (ref 4.0–10.5)
nRBC: 0 % (ref 0.0–0.2)

## 2022-10-25 LAB — LIPASE, BLOOD: Lipase: 25 U/L (ref 11–51)

## 2022-10-25 LAB — HCG, SERUM, QUALITATIVE: Preg, Serum: NEGATIVE

## 2022-10-25 MED ORDER — ONDANSETRON 4 MG PO TBDP
4.0000 mg | ORAL_TABLET | Freq: Once | ORAL | Status: AC
  Administered 2022-10-25: 4 mg via ORAL
  Filled 2022-10-25: qty 1

## 2022-10-25 NOTE — ED Notes (Signed)
Patient transported to Ultrasound 

## 2022-10-25 NOTE — ED Notes (Signed)
ED Provider at bedside. 

## 2022-10-25 NOTE — ED Provider Notes (Signed)
[place] Provider Note   CSN: [id number] Arrival date & time: 10/25/22  5427     History  Chief Complaint  Patient presents with   Abdominal Pain    Valerie Franklin is a 22 y.o. female history of MDD presented with lower abdominal pain that began last night with vaginal bleeding.  Patient is due for history.  22 year old however yesterday began having vaginal bleeding that was out of the norm.  Patient denies blood clots, blood thinners, emesis.  Patient states that she has lower abdominal pain that feels like it is cramping.  Patient does note she has history of iron deficiency anemia as well but does not feel lightheaded or pass out.  Patient denies fevers, inability to eat or drink, being recently ill, change in sensation/motor skills.  Patient denies constipation/diarrhea, dysuria, hematuria.  Patient used to have an OB/GYN because she was pregnant however was told that she would have to go through the new patient process in order to be seen and thus came to the ER to be evaluated.  Patient is not currently on birth control.  Home Medications Prior to Admission medications   Medication Sig Start Date End Date Taking? Authorizing Provider  levonorgestrel-ethinyl estradiol (VIENVA) 0.1-20 MG-MCG tablet Take 1 tablet by mouth daily. 10/03/17   Constant, Peggy, MD  oseltamivir (TAMIFLU) 75 MG capsule Take 1 capsule (75 mg total) by mouth 2 (two) times daily. 02/22/18   Faythe Ghee, PA-C      Allergies    Patient has no known allergies.    Review of Systems   Review of Systems  Gastrointestinal:  Positive for abdominal pain.    Physical Exam Updated Vital Signs BP 110/75   Pulse 71   Temp 97.7 F (36.5 C)   Resp 16   Ht 5\' 5"  (1.651 m)   Wt 52.2 kg   LMP 10/14/2022 (Within Days)   SpO2 100%   Breastfeeding Unknown   BMI 19.14 kg/m  Physical Exam Vitals reviewed.  Constitutional:      General: She is not in acute  distress. HENT:     Head: Normocephalic and atraumatic.  Eyes:     Extraocular Movements: Extraocular movements intact.     Conjunctiva/sclera: Conjunctivae normal.     Pupils: Pupils are equal, round, and reactive to light.  Cardiovascular:     Rate and Rhythm: Normal rate and regular rhythm.     Pulses: Normal pulses.     Heart sounds: Normal heart sounds.     Comments: 2+ bilateral radial/dorsalis pedis pulses with regular rate Pulmonary:     Effort: Pulmonary effort is normal. No respiratory distress.     Breath sounds: Normal breath sounds.  Abdominal:     Palpations: Abdomen is soft.     Tenderness: There is abdominal tenderness (Lower abdominal cramping when palpated). There is no guarding or rebound.  Musculoskeletal:        General: Normal range of motion.     Cervical back: Normal range of motion and neck supple.     Comments: 5 out of 5 bilateral grip/leg extension strength  Skin:    General: Skin is warm and dry.     Capillary Refill: Capillary refill takes less than 2 seconds.  Neurological:     General: No focal deficit present.     Mental Status: She is alert and oriented to person, place, and time.     Comments: Sensation intact in  all 4 limbs  Psychiatric:        Mood and Affect: Mood normal.     ED Results / Procedures / Treatments   Labs (all labs ordered are listed, but only abnormal results are displayed) Labs Reviewed  COMPREHENSIVE METABOLIC PANEL - Abnormal; Notable for the following components:      Result Value   CO2 20 (*)    All other components within normal limits  CBC WITH DIFFERENTIAL/PLATELET  LIPASE, BLOOD  HCG, SERUM, QUALITATIVE  URINALYSIS, ROUTINE W REFLEX MICROSCOPIC    EKG None  Radiology US PELVIC COMPLETE W TRANSVAGINAL AND TORSION R/O  Result Date: 10/25/2022 CLINICAL DATA:  [id number] Vaginal bleeding [id number] Pelvic pain [id number] EXAM: TRANSABDOMINAL AND TRANSVAGINAL ULTRASOUND OF PELVIS DOPPLER ULTRASOUND OF OVARIES  TECHNIQUE: Both transabdominal and transvaginal ultrasound examinations of the pelvis were performed. Transabdominal technique was performed for global imaging of the pelvis including uterus, ovaries, adnexal regions, and pelvic cul-de-sac. It was necessary to proceed with endovaginal exam following the transabdominal exam to visualize the uterus, endometrium and bilateral adnexa. Color and duplex Doppler ultrasound was utilized to evaluate blood flow to the ovaries. COMPARISON:  None Available. FINDINGS: Uterus Measurements: 4.3 x 5.3 x 7.1 cm = volume: 82.3 mL. There is a probable 2.2 x 2.3 x 2.6 cm leiomyoma in the fundal region. Otherwise unremarkable uterus. Endometrium Thickness: 7.8 mm.  No focal abnormality visualized. Right ovary Measurements: 2.8 x 3.2 x 4.0 cm = volume: 18.5 mL. Normal appearance/no adnexal mass. A dominant follicle versus follicular cyst noted in the right ovary, for which no further follow-up is recommended. Left ovary Measurements: 1.8 x 1.8 x 2.9 cm. = volume: 4.7 mL. Normal appearance/no adnexal mass. Pulsed Doppler evaluation of both ovaries demonstrates normal low-resistance arterial and venous waveforms. Other findings No abnormal free fluid. IMPRESSION: 1. Probable 2.6 cm fundal leiomyoma. 2. Otherwise unremarkable pelvic sonogram. No evidence of ovarian torsion. Electronically Signed   By: Jules Schick M.D.   On: 10/25/2022 13:15    Procedures Procedures    Medications Ordered in ED Medications  ondansetron (ZOFRAN-ODT) disintegrating tablet 4 mg (4 mg Oral Given 10/25/22 2130)    ED Course/ Medical Decision Making/ A&P                                 Medical Decision Making Amount and/or Complexity of Data Reviewed Labs: ordered. Radiology: ordered.  Risk Prescription drug management.   Valerie Franklin 22 y.o. presented today for abdominal pain. Working DDx that I considered at this time includes, but not limited to, gastroenteritis, colitis, small  bowel obstruction, appendicitis, cholecystitis, hepatobiliary pathology, gastritis, PUD, ACS, aortic dissection pancreatitis, nephrolithiasis, AAA, UTI, pyelonephritis, ruptured ectopic pregnancy, PID, ovarian torsion.  R/o DDx: gastroenteritis, colitis, small bowel obstruction, appendicitis, cholecystitis, hepatobiliary pathology, gastritis, PUD, ACS, aortic dissection pancreatitis, nephrolithiasis, AAA, UTI, pyelonephritis, ruptured ectopic pregnancy, PID, ovarian torsion: These are considered less likely due to history of present illness, physical exam, labs/imaging findings.  Review of prior external notes: 04/21/2020 ED  Unique Tests and My Interpretation:  CBC with differential: Unremarkable CMP: Unremarkable Lipase: Unremarkable Serum qualitative: Negative Pelvic ultrasound: 2.6 cm fundal leiomyoma   Discussion with Independent Historian: None  Discussion of Management of Tests: None  Risk: Low: based on diagnostic testing/clinical impression and treatment plan  Risk Stratification Score: None  Plan: On exam patient was in no acute distress stable vitals.  Lower abdominal  cramping on exam however no peritoneal signs noted.  Patient is clinically well-appearing.  Labs are reassuring.  Ultrasound shows fibroid causing patient's pain.  Recommend patient follows up with OB/GYN to be placed on birth control to help with her bleeding and to take ibuprofen every 6 hours as needed for pain.  Patient was given return precautions. Patient stable for discharge at this time.  Patient verbalized understanding of plan.  This chart was dictated using voice recognition software.  Despite best efforts to proofread,  errors can occur which can change the documentation meaning.         Final Clinical Impression(s) / ED Diagnoses Final diagnoses:  Leiomyoma of body of uterus    Rx / DC Orders ED Discharge Orders     None         Remi Deter 10/25/22 1338     Virgina Norfolk, DO 10/25/22 1443

## 2022-10-25 NOTE — ED Triage Notes (Signed)
In for eval of abd pain and vaginal bleeding onset yesterday. LMP ended approx 1 week ago. Started spotting on Saturday. Slight dysuria and mild nausea. Denies vomiting. Lower abd cramping.

## 2022-10-25 NOTE — Discharge Instructions (Addendum)
Please follow-up with your OB/GYN regarding recent symptoms and ER visit.  Today your ultrasound shows that you have a 2.6 cm fundal leiomyoma causing your abnormal uterine bleeding.  You need to speak with an OB/GYN be placed on birth control to help stop bleeding.  You may take ibuprofen every 6 hours needed for pain.  Symptoms change or worsen please return to ER.

## 2022-12-10 ENCOUNTER — Encounter: Payer: Self-pay | Admitting: Certified Nurse Midwife

## 2022-12-10 ENCOUNTER — Encounter: Payer: Self-pay | Admitting: Obstetrics and Gynecology

## 2022-12-10 ENCOUNTER — Ambulatory Visit: Payer: Medicaid Other | Admitting: Certified Nurse Midwife

## 2022-12-10 VITALS — BP 108/68 | HR 79 | Ht 66.0 in | Wt 112.8 lb

## 2022-12-10 DIAGNOSIS — N939 Abnormal uterine and vaginal bleeding, unspecified: Secondary | ICD-10-CM

## 2022-12-10 DIAGNOSIS — Z113 Encounter for screening for infections with a predominantly sexual mode of transmission: Secondary | ICD-10-CM | POA: Diagnosis not present

## 2022-12-10 DIAGNOSIS — Z124 Encounter for screening for malignant neoplasm of cervix: Secondary | ICD-10-CM | POA: Diagnosis not present

## 2022-12-10 DIAGNOSIS — D219 Benign neoplasm of connective and other soft tissue, unspecified: Secondary | ICD-10-CM | POA: Diagnosis not present

## 2022-12-10 NOTE — Progress Notes (Unsigned)
NGYN presents to establish care. Pt c/o irregular bleeding 1 month. Pt reports fibroids. Bleeding has stopped with BC pills   Last PAP 03/2020 Requesting STD

## 2022-12-13 NOTE — Progress Notes (Signed)
   GYNECOLOGY OFFICE VISIT NOTE  History:   Valerie Franklin is a 22 y.o. G1P0010 here today for Annual GYN visit. She denies any abnormal vaginal discharge, bleeding, pelvic pain or other concerns. She reports a 21 days cycle previously with a 4 day bleed.   She also has concerns for Fibroids.    Past Medical History:  Diagnosis Date   At risk for intentional self-harm 05/12/2016   MDD (major depressive disorder), single episode, moderate (HCC) 05/12/2016    Past Surgical History:  Procedure Laterality Date   HERNIA REPAIR     UMBILICAL HERNIA REPAIR      The following portions of the patient's history were reviewed and updated as appropriate: allergies, current medications, past family history, past medical history, past social history, past surgical history and problem list.   Health Maintenance:  Normal pap and negative HRHPV on 03/24/2020.  Review of Systems:  Pertinent items noted in HPI and remainder of comprehensive ROS otherwise negative.  Physical Exam:  BP 108/68   Pulse 79   Ht 5\' 6"  (1.676 m)   Wt 112 lb 12.8 oz (51.2 kg)   LMP 11/26/2022   BMI 18.21 kg/m  CONSTITUTIONAL: Well-developed, well-nourished female in no acute distress.  HEENT:  Normocephalic, atraumatic. External right and left ear normal. No scleral icterus.  NECK: Normal range of motion, supple, no masses noted on observation SKIN: No rash noted. Not diaphoretic. No erythema. No pallor. MUSCULOSKELETAL: Normal range of motion. No edema noted. NEUROLOGIC: Alert and oriented to person, place, and time. Normal muscle tone coordination. No cranial nerve deficit noted. PSYCHIATRIC: Normal mood and affect. Normal behavior. Normal judgment and thought content. CARDIOVASCULAR: Normal heart rate noted RESPIRATORY: Effort and breath sounds normal, no problems with respiration noted ABDOMEN: No masses noted. No other overt distention noted.   PELVIC: Deferred per patient request  Labs and Imaging No results  found for this or any previous visit (from the past 168 hour(s)). No results found.    Assessment and Plan:    1. Abnormal uterine bleeding (AUB) - Patient states that 1 month ago she was seen in the ED for prolonged vaginal bleeding. Patient states the she was then placed on birth control pills and states that since taking them bleeding has stopped and she has had no more complaints.  - Recommended to continue Birth control use as directed.   2. Screening examination for STD (sexually transmitted disease) - Patient declined STD testing today.   3. Pap smear for cervical cancer screening - Offered to complete PAP for patient. Patient declined.   4. Fibroids - Patient has a 2cm fibroid noted on [place].  - Reviewed fibroids and how they function. Patient reassured following discussion.    Routine preventative health maintenance measures emphasized. Please refer to After Visit Summary for other counseling recommendations.   Return in about 4 months (around 04/09/2023) for ANNUAL wiht PAP .    I spent 30 minutes dedicated to the care of this patient including pre-visit review of records, face to face time with the patient discussing her conditions and treatments and post visit orders.    Jshon Ibe Danella Deis) Suzie Portela, MSN, CNM  [facility]  12/13/22 3:57 PM

## 2024-01-11 ENCOUNTER — Other Ambulatory Visit (HOSPITAL_BASED_OUTPATIENT_CLINIC_OR_DEPARTMENT_OTHER): Payer: Self-pay

## 2024-01-11 ENCOUNTER — Other Ambulatory Visit: Payer: Self-pay

## 2024-01-11 ENCOUNTER — Emergency Department (HOSPITAL_BASED_OUTPATIENT_CLINIC_OR_DEPARTMENT_OTHER)
Admission: EM | Admit: 2024-01-11 | Discharge: 2024-01-11 | Disposition: A | Discharge status: Home / Self Care | Attending: Emergency Medicine | Admitting: Emergency Medicine

## 2024-01-11 ENCOUNTER — Encounter (HOSPITAL_BASED_OUTPATIENT_CLINIC_OR_DEPARTMENT_OTHER): Payer: Self-pay | Admitting: Emergency Medicine

## 2024-01-11 DIAGNOSIS — R059 Cough, unspecified: Secondary | ICD-10-CM | POA: Diagnosis present

## 2024-01-11 DIAGNOSIS — J101 Influenza due to other identified influenza virus with other respiratory manifestations: Secondary | ICD-10-CM | POA: Insufficient documentation

## 2024-01-11 DIAGNOSIS — J111 Influenza due to unidentified influenza virus with other respiratory manifestations: Secondary | ICD-10-CM

## 2024-01-11 LAB — RESP PANEL BY RT-PCR (RSV, FLU A&B, COVID)  RVPGX2
Influenza A by PCR: POSITIVE — AB
Influenza B by PCR: NEGATIVE
Resp Syncytial Virus by PCR: NEGATIVE
SARS Coronavirus 2 by RT PCR: NEGATIVE

## 2024-01-11 MED ORDER — GUAIFENESIN 100 MG/5ML PO LIQD
100.0000 mg | ORAL | 0 refills | Status: AC | PRN
  Filled 2024-01-11: qty 473, 8d supply, fill #0

## 2024-01-11 NOTE — ED Triage Notes (Signed)
 Cough since yesterday.

## 2024-01-11 NOTE — ED Provider Notes (Signed)
 " [place] [facility] AT [facility] Provider Note   CSN: [id number] Arrival date & time: 01/11/24  1011     Patient presents with: Cough   Valerie Franklin is a 23 y.o. female.   HPI 23 year old female previously healthy presents today complaining of cough, congestion, chest pain with coughing, and low-grade fever.  She denies nausea vomiting diarrhea or abdominal pain.    Prior to Admission medications  Medication Sig Start Date End Date Taking? Authorizing Provider  guaiFENesin  (ROBITUSSIN) 100 MG/5ML liquid Take 5-10 mLs (100-200 mg total) by mouth every 4 (four) hours as needed for cough or to loosen phlegm. 01/11/24  Yes Levander Houston, MD  levonorgestrel -ethinyl estradiol (VIENVA) 0.1-20 MG-MCG tablet Take 1 tablet by mouth daily. 10/03/17   Constant, Peggy, MD  oseltamivir  (TAMIFLU ) 75 MG capsule Take 1 capsule (75 mg total) by mouth 2 (two) times daily. Patient not taking: Reported on 12/10/2022 02/22/18   Gasper Devere ORN, PA-C    Allergies: Patient has no known allergies.    Review of Systems  Updated Vital Signs BP 113/74 (BP Location: Right Arm)   Pulse (!) 106   Temp 98.3 F (36.8 C)   Resp 17   Ht 1.651 m (5' 5)   Wt 56.7 kg   LMP 01/02/2024   SpO2 100%   BMI 20.80 kg/m   Physical Exam Vitals and nursing note reviewed.  Constitutional:      Appearance: Normal appearance.  HENT:     Head: Normocephalic.     Right Ear: External ear normal.     Left Ear: External ear normal.     Nose: Nose normal.     Mouth/Throat:     Mouth: Mucous membranes are moist.     Pharynx: Oropharynx is clear.  Eyes:     Extraocular Movements: Extraocular movements intact.     Pupils: Pupils are equal, round, and reactive to light.  Cardiovascular:     Rate and Rhythm: Normal rate and regular rhythm.     Pulses: Normal pulses.  Pulmonary:     Effort: Pulmonary effort is normal.     Breath sounds: Normal breath sounds.  Abdominal:     General:  Abdomen is flat.     Palpations: Abdomen is soft.  Musculoskeletal:        General: Normal range of motion.     Cervical back: Normal range of motion.  Skin:    General: Skin is warm.     Capillary Refill: Capillary refill takes less than 2 seconds.  Neurological:     General: No focal deficit present.     Mental Status: She is alert.     (all labs ordered are listed, but only abnormal results are displayed) Labs Reviewed  RESP PANEL BY RT-PCR (RSV, FLU A&B, COVID)  [id number]    EKG: None  Radiology: No results found.   Procedures   Medications Ordered in the ED - No data to display                                  Medical Decision Making  23 year old female otherwise healthy presents today with nasal congestion and cough.  She has had some low-grade fever.  She has had some pain in her chest with coughing.  Pain is only with coughing and I have no concern for acute coronary syndrome, disruption of major vessels.  On exam patient's lungs  are clear oxygen saturation is normal doubt pneumonia or significant lung process.  Currently fluid rates are high.  RSV, flu, COVID pending.  Discussed this with patient.  Plan discharge and she will follow-up on MyChart.     Final diagnoses:  Influenza-like illness    ED Discharge Orders          Ordered    guaiFENesin  (ROBITUSSIN) 100 MG/5ML liquid  Every 4 hours PRN        01/11/24 1035               Levander Houston, MD 01/11/24 1035  "
